# Patient Record
Sex: Female | Born: 1995 | Race: White | Hispanic: No | Marital: Single | Smoking: Never smoker
Health system: Southern US, Community
[De-identification: ages and names within clinical notes are randomized; demographics above are authoritative.]

---
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
## 2013-06-08 ENCOUNTER — Ambulatory Visit (INDEPENDENT_AMBULATORY_CARE_PROVIDER_SITE_OTHER): Payer: Managed Care, Other (non HMO) | Admitting: Family Medicine

## 2013-06-08 ENCOUNTER — Encounter: Payer: Self-pay | Admitting: Family Medicine

## 2013-06-08 VITALS — BP 107/72 | HR 90 | Wt 116.0 lb

## 2013-06-08 DIAGNOSIS — R1012 Left upper quadrant pain: Secondary | ICD-10-CM

## 2013-06-08 DIAGNOSIS — R0789 Other chest pain: Secondary | ICD-10-CM

## 2013-06-08 NOTE — Progress Notes (Signed)
   Subjective:    Patient ID: Ruth Aguilar, female    DOB: 01/25/1995, 18 y.o.   MRN: [id number]  HPI Left lateral chest wall pain x 4 days, bothers her more after she eats. Says starts under her left breast and radiates towards the lateral side wall. Doesn't really change with physical activity or change in position. Her psych recently started her buspiron.  No trauma or injury. Pain is better today but worse usually in the AM.  Pian is sharp and comes and goes and feels tight.  No nausea or change in bowels. No constipation. WAking her up at night. No fever, chillls or sweats. No radiation of pain. No SOB or palpitations.   Review of Systems     Objective:   Physical Exam  Constitutional: She is oriented to person, place, and time. She appears well-developed and well-nourished.  HENT:  Head: Normocephalic and atraumatic.  Right Ear: External ear normal.  Left Ear: External ear normal.  Nose: Nose normal.  Eyes: Conjunctivae and EOM are normal. Pupils are equal, round, and reactive to light.  Cardiovascular: Normal rate, regular rhythm and normal heart sounds.   Pulmonary/Chest: Effort normal and breath sounds normal. She has no wheezes. She exhibits no tenderness.  Abdominal: Soft. Bowel sounds are normal. She exhibits no distension and no mass. There is no tenderness. There is no rebound and no guarding.  Musculoskeletal: She exhibits no edema.  Lymphadenopathy:    She has no cervical adenopathy.  Neurological: She is alert and oriented to person, place, and time.  Skin: Skin is warm and dry.  Psychiatric: She has a normal mood and affect.          Assessment & Plan:  Left upper quadrant pain/atypical chest pain-unclear etiology at this point. It sounds somewhat gastric related since it does seem to get worse after eating but the location of her pain is a little bit on her stomach or esophageal pain. Consider pancreatitis and will do lab work to evaluate this. We'll also do CBC  with differential. She also recently started buspirone and stopped it yesterday and she actually does feel little bit better today. Certainly this could be related. I am want to do an EKG since her pain is over the left side of her lower chest and upper abdomen. She's not had any shortness of breath.  If labs are normal and still having pain then will start her on a PPI trial.   EKG shows rate of 66 beats per minute, normal sinus rhythm with no acute changes.Gave reassurance.

## 2013-06-09 LAB — CBC WITH DIFFERENTIAL/PLATELET
BASOS ABS: 0 10*3/uL (ref 0.0–0.1)
Basophils Relative: 0 % (ref 0–1)
EOS ABS: 0.1 10*3/uL (ref 0.0–0.7)
EOS PCT: 1 % (ref 0–5)
HCT: 40.5 % (ref 36.0–46.0)
Hemoglobin: 13.4 g/dL (ref 12.0–15.0)
Lymphocytes Relative: 26 % (ref 12–46)
Lymphs Abs: 1.8 10*3/uL (ref 0.7–4.0)
MCH: 29.3 pg (ref 26.0–34.0)
MCHC: 33.1 g/dL (ref 30.0–36.0)
MCV: 88.4 fL (ref 78.0–100.0)
Monocytes Absolute: 0.5 10*3/uL (ref 0.1–1.0)
Monocytes Relative: 7 % (ref 3–12)
Neutro Abs: 4.6 10*3/uL (ref 1.7–7.7)
Neutrophils Relative %: 66 % (ref 43–77)
PLATELETS: 277 10*3/uL (ref 150–400)
RBC: 4.58 MIL/uL (ref 3.87–5.11)
RDW: 14.1 % (ref 11.5–15.5)
WBC: 7 10*3/uL (ref 4.0–10.5)

## 2013-06-09 LAB — COMPLETE METABOLIC PANEL WITH GFR
ALK PHOS: 37 U/L — AB (ref 39–117)
ALT: 9 U/L (ref 0–35)
AST: 18 U/L (ref 0–37)
Albumin: 4.3 g/dL (ref 3.5–5.2)
BILIRUBIN TOTAL: 0.6 mg/dL (ref 0.2–1.2)
BUN: 12 mg/dL (ref 6–23)
CO2: 27 meq/L (ref 19–32)
CREATININE: 0.91 mg/dL (ref 0.50–1.10)
Calcium: 9.6 mg/dL (ref 8.4–10.5)
Chloride: 104 mEq/L (ref 96–112)
GFR, Est African American: >89 mL/min
GFR, Est Non African American: >89 mL/min
Glucose, Bld: 92 mg/dL (ref 70–99)
Potassium: 4.2 mEq/L (ref 3.5–5.3)
Sodium: 139 mEq/L (ref 135–145)
Total Protein: 7.5 g/dL (ref 6.0–8.3)

## 2013-06-09 LAB — AMYLASE: Amylase: 67 U/L (ref 0–105)

## 2013-06-09 LAB — LIPASE: Lipase: 55 U/L (ref 0–75)

## 2013-06-10 ENCOUNTER — Other Ambulatory Visit: Payer: Self-pay | Admitting: Family Medicine

## 2013-06-11 ENCOUNTER — Encounter: Payer: Self-pay | Admitting: *Deleted

## 2013-06-15 MED ORDER — OMEPRAZOLE 20 MG PO CPDR: 20.0000 mg | DELAYED_RELEASE_CAPSULE | Freq: Every day | ORAL | Status: DC

## 2013-06-17 ENCOUNTER — Telehealth: Payer: Self-pay | Admitting: *Deleted

## 2013-06-17 NOTE — Telephone Encounter (Signed)
LMOM notifying her that I'm placing her rx for Omeprazole at the front desk.  I've tried several attempts to get the correct pharmacy from the pt with no return call.

## 2016-06-13 ENCOUNTER — Ambulatory Visit: Payer: Managed Care, Other (non HMO) | Admitting: Physician Assistant

## 2016-06-14 ENCOUNTER — Ambulatory Visit (INDEPENDENT_AMBULATORY_CARE_PROVIDER_SITE_OTHER): Payer: Managed Care, Other (non HMO) | Admitting: Family Medicine

## 2016-06-14 ENCOUNTER — Encounter: Payer: Self-pay | Admitting: Family Medicine

## 2016-06-14 VITALS — BP 105/62 | HR 59 | Wt 118.0 lb

## 2016-06-14 DIAGNOSIS — Z111 Encounter for screening for respiratory tuberculosis: Secondary | ICD-10-CM | POA: Diagnosis not present

## 2016-06-14 DIAGNOSIS — Z0000 Encounter for general adult medical examination without abnormal findings: Secondary | ICD-10-CM | POA: Diagnosis not present

## 2016-06-14 NOTE — Progress Notes (Signed)
Subjective:     Ruth Aguilar is a 21 y.o. female and is here for a comprehensive physical exam. The patient reports no problems.  [place] to the area. 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 She's been volunteering at [employer].   Social History   Social History  . Marital status: Single    Spouse name: N/A  . Number of children: N/A  . Years of education: N/A   Occupational History  . Not on file.   Social History Main Topics  . Smoking status: Never Smoker  . Smokeless tobacco: Never Used  . Alcohol use No  . Drug use: No  . Sexual activity: No   Other Topics Concern  . Not on file   Social History Narrative  . No narrative on file   Health Maintenance  Topic Date Due  . HIV Screening  01/27/2008  . PAP SMEAR  04/26/2014  . INFLUENZA VACCINE  08/21/2016  . TETANUS/TDAP  06/25/2021    The following portions of the patient's history were reviewed and updated as appropriate: allergies, current medications, past family history, past medical history, past social history, past surgical history and problem list.  Review of Systems A comprehensive review of systems was negative.   Objective:    BP 105/62   Pulse (!) 59   Wt 118 lb (53.5 kg)   BMI 20.29 kg/m  General appearance: alert, cooperative and appears stated age Head: Normocephalic, without obvious abnormality, atraumatic Eyes: conj clear, EOMI, PEERLA Ears: normal TM's and external ear canals both ears Nose: Nares normal. Septum midline. Mucosa normal. No drainage or sinus tenderness. Throat: lips, mucosa, and tongue normal; teeth and gums normal Neck: no adenopathy, no carotid bruit, no JVD, supple, symmetrical, trachea midline and thyroid not enlarged, symmetric, no tenderness/mass/nodules Back: symmetric, no curvature. ROM normal. No CVA tenderness. Lungs: clear to auscultation  bilaterally Heart: regular rate and rhythm, S1, S2 normal, no murmur, click, rub or gallop Abdomen: soft, non-tender; bowel sounds normal; no masses,  no organomegaly Extremities: extremities normal, atraumatic, no cyanosis or edema Pulses: 2+ and symmetric Skin: Skin color, texture, turgor normal. No rashes or lesions Lymph nodes: Cervical, supraclavicular, and axillary nodes normal. Neurologic: Alert and oriented X 3, normal strength and tone. Normal symmetric reflexes. Normal coordination and gait    Assessment:    Healthy female exam.      Plan:     See After Visit Summary for Counseling Recommendations   Keep up a regular exercise program and make sure you are eating a healthy diet Try to eat 4 servings of dairy a day, or if you are lactose intolerant take a calcium with vitamin D daily.  Your vaccines are up to date.   She gets [facility] and says that she actually plans on getting back in with them this summer to get her Pap smear updated.

## 2016-06-14 NOTE — Patient Instructions (Addendum)
Health Maintenance, Female Adopting a healthy lifestyle and getting preventive care can go a long way to promote health and wellness. Talk with your health care provider about what schedule of regular examinations is right for you. This is a good chance for you to check in with your provider about disease prevention and staying healthy. In between checkups, there are plenty of things you can do on your own. Experts have done a lot of research about which lifestyle changes and preventive measures are most likely to keep you healthy. Ask your health care provider for more information. Weight and diet Eat a healthy diet  Be sure to include plenty of vegetables, fruits, low-fat dairy products, and lean protein.  Do not eat a lot of foods high in solid fats, added sugars, or salt.  Get regular exercise. This is one of the most important things you can do for your health.  Most adults should exercise for at least 150 minutes each week. The exercise should increase your heart rate and make you sweat (moderate-intensity exercise).  Most adults should also do strengthening exercises at least twice a week. This is in addition to the moderate-intensity exercise. Maintain a healthy weight  Body mass index (BMI) is a measurement that can be used to identify possible weight problems. It estimates body fat based on height and weight. Your health care provider can help determine your BMI and help you achieve or maintain a healthy weight.  For females 76 years of age and older:  A BMI below 18.5 is considered underweight.  A BMI of 18.5 to 24.9 is normal.  A BMI of 25 to 29.9 is considered overweight.  A BMI of 30 and above is considered obese. Watch levels of cholesterol and blood lipids  You should start having your blood tested for lipids and cholesterol at 23 years of age, then have this test every 5 years.  You may need to have your cholesterol levels checked more often if:  Your lipid or  cholesterol levels are high.  You are older than 23 years of age.  You are at high risk for heart disease. Cancer screening Lung Cancer  Lung cancer screening is recommended for adults 64-42 years old who are at high risk for lung cancer because of a history of smoking.  A yearly low-dose CT scan of the lungs is recommended for people who:  Currently smoke.  Have quit within the past 15 years.  Have at least a 30-pack-year history of smoking. A pack year is smoking an average of one pack of cigarettes a day for 1 year.  Yearly screening should continue until it has been 15 years since you quit.  Yearly screening should stop if you develop a health problem that would prevent you from having lung cancer treatment. Breast Cancer  Practice breast self-awareness. This means understanding how your breasts normally appear and feel.  It also means doing regular breast self-exams. Let your health care provider know about any changes, no matter how small.  If you are in your 20s or 30s, you should have a clinical breast exam (CBE) by a health care provider every 1-3 years as part of a regular health exam.  If you are 34 or older, have a CBE every year. Also consider having a breast X-ray (mammogram) every year.  If you have a family history of breast cancer, talk to your health care provider about genetic screening.  If you are at high risk for breast cancer, talk  to your health care provider about having an MRI and a mammogram every year.  Breast cancer gene (BRCA) assessment is recommended for women who have family members with BRCA-related cancers. BRCA-related cancers include:  Breast.  Ovarian.  Tubal.  Peritoneal cancers.  Results of the assessment will determine the need for genetic counseling and BRCA1 and BRCA2 testing. Cervical Cancer  Your health care provider may recommend that you be screened regularly for cancer of the pelvic organs (ovaries, uterus, and vagina).  This screening involves a pelvic examination, including checking for microscopic changes to the surface of your cervix (Pap test). You may be encouraged to have this screening done every 3 years, beginning at age 24.  For women ages 66-65, health care providers may recommend pelvic exams and Pap testing every 3 years, or they may recommend the Pap and pelvic exam, combined with testing for human papilloma virus (HPV), every 5 years. Some types of HPV increase your risk of cervical cancer. Testing for HPV may also be done on women of any age with unclear Pap test results.  Other health care providers may not recommend any screening for nonpregnant women who are considered low risk for pelvic cancer and who do not have symptoms. Ask your health care provider if a screening pelvic exam is right for you.  If you have had past treatment for cervical cancer or a condition that could lead to cancer, you need Pap tests and screening for cancer for at least 20 years after your treatment. If Pap tests have been discontinued, your risk factors (such as having a new sexual partner) need to be reassessed to determine if screening should resume. Some women have medical problems that increase the chance of getting cervical cancer. In these cases, your health care provider may recommend more frequent screening and Pap tests. Colorectal Cancer  This type of cancer can be detected and often prevented.  Routine colorectal cancer screening usually begins at 23 years of age and continues through 23 years of age.  Your health care provider may recommend screening at an earlier age if you have risk factors for colon cancer.  Your health care provider may also recommend using home test kits to check for hidden blood in the stool.  A small camera at the end of a tube can be used to examine your colon directly (sigmoidoscopy or colonoscopy). This is done to check for the earliest forms of colorectal cancer.  Routine  screening usually begins at age 41.  Direct examination of the colon should be repeated every 5-10 years through 23 years of age. However, you may need to be screened more often if early forms of precancerous polyps or small growths are found. Skin Cancer  Check your skin from head to toe regularly.  Tell your health care provider about any new moles or changes in moles, especially if there is a change in a mole's shape or color.  Also tell your health care provider if you have a mole that is larger than the size of a pencil eraser.  Always use sunscreen. Apply sunscreen liberally and repeatedly throughout the day.  Protect yourself by wearing long sleeves, pants, a wide-brimmed hat, and sunglasses whenever you are outside. Heart disease, diabetes, and high blood pressure  High blood pressure causes heart disease and increases the risk of stroke. High blood pressure is more likely to develop in:  People who have blood pressure in the high end of the normal range (130-139/85-89 mm Hg).  People who are overweight or obese.  People who are African American.  If you are 59-24 years of age, have your blood pressure checked every 3-5 years. If you are 34 years of age or older, have your blood pressure checked every year. You should have your blood pressure measured twice-once when you are at a hospital or clinic, and once when you are not at a hospital or clinic. Record the average of the two measurements. To check your blood pressure when you are not at a hospital or clinic, you can use:  An automated blood pressure machine at a pharmacy.  A home blood pressure monitor.  If you are between 29 years and 60 years old, ask your health care provider if you should take aspirin to prevent strokes.  Have regular diabetes screenings. This involves taking a blood sample to check your fasting blood sugar level.  If you are at a normal weight and have a low risk for diabetes, have this test once  every three years after 23 years of age.  If you are overweight and have a high risk for diabetes, consider being tested at a younger age or more often. Preventing infection Hepatitis B  If you have a higher risk for hepatitis B, you should be screened for this virus. You are considered at high risk for hepatitis B if:  You were born in a country where hepatitis B is common. Ask your health care provider which countries are considered high risk.  Your parents were born in a high-risk country, and you have not been immunized against hepatitis B (hepatitis B vaccine).  You have HIV or AIDS.  You use needles to inject street drugs.  You live with someone who has hepatitis B.  You have had sex with someone who has hepatitis B.  You get hemodialysis treatment.  You take certain medicines for conditions, including cancer, organ transplantation, and autoimmune conditions. Hepatitis C  Blood testing is recommended for:  Everyone born from [birth year] through 1967.  Anyone with known risk factors for hepatitis C. Sexually transmitted infections (STIs)  You should be screened for sexually transmitted infections (STIs) including gonorrhea and chlamydia if:  You are sexually active and are younger than 23 years of age.  You are older than 23 years of age and your health care provider tells you that you are at risk for this type of infection.  Your sexual activity has changed since you were last screened and you are at an increased risk for chlamydia or gonorrhea. Ask your health care provider if you are at risk.  If you do not have HIV, but are at risk, it may be recommended that you take a prescription medicine daily to prevent HIV infection. This is called pre-exposure prophylaxis (PrEP). You are considered at risk if:  You are sexually active and do not regularly use condoms or know the HIV status of your partner(s).  You take drugs by injection.  You are sexually active with a partner  who has HIV. Talk with your health care provider about whether you are at high risk of being infected with HIV. If you choose to begin PrEP, you should first be tested for HIV. You should then be tested every 3 months for as long as you are taking PrEP. Pregnancy  If you are premenopausal and you may become pregnant, ask your health care provider about preconception counseling.  If you may become pregnant, take 400 to 800 micrograms (mcg) of folic acid  every day.  If you want to prevent pregnancy, talk to your health care provider about birth control (contraception). Osteoporosis and menopause  Osteoporosis is a disease in which the bones lose minerals and strength with aging. This can result in serious bone fractures. Your risk for osteoporosis can be identified using a bone density scan.  If you are 4 years of age or older, or if you are at risk for osteoporosis and fractures, ask your health care provider if you should be screened.  Ask your health care provider whether you should take a calcium or vitamin D supplement to lower your risk for osteoporosis.  Menopause may have certain physical symptoms and risks.  Hormone replacement therapy may reduce some of these symptoms and risks. Talk to your health care provider about whether hormone replacement therapy is right for you. Follow these instructions at home:  Schedule regular health, dental, and eye exams.  Stay current with your immunizations.  Do not use any tobacco products including cigarettes, chewing tobacco, or electronic cigarettes.  If you are pregnant, do not drink alcohol.  If you are breastfeeding, limit how much and how often you drink alcohol.  Limit alcohol intake to no more than 1 drink per day for nonpregnant women. One drink equals 12 ounces of beer, 5 ounces of wine, or 1 ounces of hard liquor.  Do not use street drugs.  Do not share needles.  Ask your health care provider for help if you need support  or information about quitting drugs.  Tell your health care provider if you often feel depressed.  Tell your health care provider if you have ever been abused or do not feel safe at home. This information is not intended to replace advice given to you by your health care provider. Make sure you discuss any questions you have with your health care provider. Document Released: 07/23/2010 Document Revised: 06/15/2015 Document Reviewed: 10/11/2014 Elsevier Interactive Patient Education  2017 [employer].

## 2016-10-11 ENCOUNTER — Ambulatory Visit (INDEPENDENT_AMBULATORY_CARE_PROVIDER_SITE_OTHER): Payer: Managed Care, Other (non HMO) | Admitting: Physician Assistant

## 2016-10-11 VITALS — BP 109/70 | HR 67 | Wt 119.0 lb

## 2016-10-11 DIAGNOSIS — H538 Other visual disturbances: Secondary | ICD-10-CM

## 2016-10-11 NOTE — Progress Notes (Signed)
HPI:                                                                Ruth Aguilar is a 21 y.o. female who presents to [facility] today for blurred vision  This pleasant 21 yo F with no significant PMH reports blurred vision x 6 days. Onset was Sunday, gradually worsening. States she has difficulty focusing on people's faces to the point that she got lost in a restaurant and could not locate her family. Describes symptoms as feeling "like I'm in a cloud." Denies any head trauma or injury. She denies fever, chills, headache, dizziness, LOC, paresthesias, weakness, trouble word finding, or unsteady gait. Denies double vision, sudden loss of vision or eye pain. Denies hearing loss or tinnitus.  No past medical history on file. No past surgical history on file. Social History  Substance Use Topics  . Smoking status: Never Smoker  . Smokeless tobacco: Never Used  . Alcohol use No   family history is not on file.  ROS: Review of Systems  Constitutional: Negative.   HENT: Negative for hearing loss and tinnitus.   Eyes: Positive for blurred vision.  Cardiovascular: Negative.   Gastrointestinal: Negative for nausea and vomiting.  Musculoskeletal: Negative for falls and neck pain.  Neurological: Negative for dizziness, tingling, tremors, sensory change, speech change and headaches.     Medications: Current Outpatient Prescriptions  Medication Sig Dispense Refill  . Norethin-Eth Estradiol-Fe (GENERESS FE PO) Take 1 tablet by mouth daily.     No current facility-administered medications for this visit.    No Known Allergies   Objective:  BP 109/70   Pulse 67   Wt 119 lb (54 kg)   BMI 20.46 kg/m  Gen: well-groomed, not ill-appearing, no acute distress HEENT: head normocephalic, atraumatic; conjunctiva and cornea clear, oropharynx clear, moist mucus membranes Pulm: Normal work of breathing, normal phonation, clear to auscultation  bilaterally CV: Normal rate, regular rhythm, s1 and s2 distinct, no murmurs, clicks or rubs Neuro:  cranial nerves II-XII intact, no nystagmus, no papilledema, normal finger-to-nose, normal heel-to-shin, negative pronator drift, normal coordination, DTR's intact, normal tone, no tremor MSK: strength 5/5 and symmetric in bilateral upper and lower extremities, normal gait and station, negative Romberg Mental Status: alert and oriented x 3, speech articulate, and thought processes clear and goal-directed   No results found for this or any previous visit (from the past 72 hour(s)). No results found.    Assessment and Plan: 21 y.o. female with   1. Blurred vision - reassuring neurologic exam. Consulted Dr. Darra Lis who felt exam to be normal and recommend referral to Ophthalmology. Contacted 3 offices and was unable to get a same-day appointment. - Ambulatory referral to Ophthalmology   Patient education and anticipatory guidance given Patient agrees with treatment plan Follow-up as needed if symptoms worsen or fail to improve  Levonne Hubert PA-C

## 2016-10-14 ENCOUNTER — Encounter: Payer: Self-pay | Admitting: Physician Assistant

## 2016-10-14 DIAGNOSIS — H538 Other visual disturbances: Secondary | ICD-10-CM | POA: Insufficient documentation

## 2018-05-13 ENCOUNTER — Encounter: Payer: Self-pay | Admitting: Family Medicine

## 2018-05-13 ENCOUNTER — Ambulatory Visit (INDEPENDENT_AMBULATORY_CARE_PROVIDER_SITE_OTHER): Payer: Managed Care, Other (non HMO) | Admitting: Family Medicine

## 2018-05-13 VITALS — Temp 98.9°F | Ht 62.5 in | Wt 118.0 lb

## 2018-05-13 DIAGNOSIS — R21 Rash and other nonspecific skin eruption: Secondary | ICD-10-CM

## 2018-05-13 DIAGNOSIS — Z008 Encounter for other general examination: Secondary | ICD-10-CM | POA: Insufficient documentation

## 2018-05-13 MED ORDER — PREDNISONE 20 MG PO TABS: ORAL_TABLET | ORAL | 0 refills | Status: AC

## 2018-05-13 NOTE — Progress Notes (Signed)
Virtual Visit via Video Note  I connected with Ruth Aguilar on 05/13/18 at  9:30 AM EDT by a video enabled telemedicine application and verified that I am speaking with the correct person using two identifiers.   I discussed the limitations of evaluation and management by telemedicine and the availability of in person appointments. The patient expressed understanding and agreed to proceed.  Patient was at home and I was in my office for this virtual visit.  Subjective:    CC: Rash   HPI: 23 year old female complains of a rash that is been on her face for about a week.  She says that the rash is itchy and feels like it gets hot.  She says about 2-3 weeks ago she actually had poison oak on her arms and legs and was treated with steroids.  She finishe her steroids on the 12th and then facial rash started 2 days later.  She says that did seem to clear up.  She has tried using just a little bit of triamcinolone on her face.  She denies any other changes to soaps, lotions, detergents etc. No correlation to diet.  It is really umcomfortable.  3 days had not symptoms. Feels really tired. No fever.  She says that the lesions on her legs and abdomen have resolved.   Past medical history, Surgical history, Family history not pertinant except as noted below, Social history, Allergies, and medications have been entered into the medical record, reviewed, and corrections made.   Temp 98.9 F (37.2 C)   Ht 5' 2.5" (1.588 m)   Wt 118 lb (53.5 kg)   LMP 04/24/2018 (Approximate)   BMI 21.24 kg/m     No Known Allergies  No past medical history on file.  No past surgical history on file.  Social History   Socioeconomic History  . Marital status: Single    Spouse name: Not on file  . Number of children: Not on file  . Years of education: Not on file  . Highest education level: Not on file  Occupational History  . Not on file  Social Needs  . Financial resource strain: Not on file  . Food  insecurity:    Worry: Not on file    Inability: Not on file  . Transportation needs:    Medical: Not on file    Non-medical: Not on file  Tobacco Use  . Smoking status: Never Smoker  . Smokeless tobacco: Never Used  Substance and Sexual Activity  . Alcohol use: No  . Drug use: No  . Sexual activity: Never    Birth control/protection: Pill  Lifestyle  . Physical activity:    Days per week: Not on file    Minutes per session: Not on file  . Stress: Not on file  Relationships  . Social connections:    Talks on phone: Not on file    Gets together: Not on file    Attends religious service: Not on file    Active member of club or organization: Not on file    Attends meetings of clubs or organizations: Not on file    Relationship status: Not on file  . Intimate partner violence:    Fear of current or ex partner: Not on file    Emotionally abused: Not on file    Physically abused: Not on file    Forced sexual activity: Not on file  Other Topics Concern  . Not on file  Social History Narrative  . Not  on file    No family history on file.  Outpatient Encounter Medications as of 05/13/2018  Medication Sig  . ESTARYLLA 0.25-35 MG-MCG tablet   . triamcinolone ointment (KENALOG) 0.1 %   . predniSONE (DELTASONE) 20 MG tablet Take 1 tablet (20 mg total) by mouth daily with breakfast for 6 days, THEN 0.5 tablets (10 mg total) daily with breakfast for 6 days.  . [DISCONTINUED] Norethin-Eth Estradiol-Fe (GENERESS FE PO) Take 1 tablet by mouth daily.   No facility-administered encounter medications on file as of 05/13/2018.        Objective:    General: Speaking clearly in complete sentences without any shortness of breath.  Alert and oriented x3.  Normal judgment. No apparent acute distress.  She did send me some pictures over my prior chart which I did view and she does have a little bit of erythema to her facial cheeks.  No open wounds or drainage.    Impression and  Recommendations:    Rash  On her face.  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  We discussed treatment options I like to put her on another round of prednisone low-dose since is just affecting her face at this point.  I want her to really stay out of the sun as well I think she is getting some sun sensitivity which is worsening and triggering the rash especially since she says it feels better when she first gets up in the morning and then as the day goes on it gets worse.  She said she is been outside a lot and has been working out outside.  If she is not improving after 1 to 2 weeks then please let me know and I will do additional work-up including some blood work.    I discussed the assessment and treatment plan with the patient. The patient was provided an opportunity to ask questions and all were answered. The patient agreed with the plan and demonstrated an understanding of the instructions.   The patient was advised to call back or seek an in-person evaluation if the symptoms worsen or if the condition fails to improve as anticipated.   Nani Gasser, MD

## 2018-08-14 ENCOUNTER — Telehealth (INDEPENDENT_AMBULATORY_CARE_PROVIDER_SITE_OTHER): Payer: Managed Care, Other (non HMO) | Admitting: Family Medicine

## 2018-08-14 ENCOUNTER — Encounter: Payer: Self-pay | Admitting: Family Medicine

## 2018-08-14 VITALS — Temp 98.0°F | Ht 62.52 in | Wt 119.1 lb

## 2018-08-14 DIAGNOSIS — R1013 Epigastric pain: Secondary | ICD-10-CM | POA: Diagnosis not present

## 2018-08-14 MED ORDER — PROMETHAZINE HCL 25 MG PO TABS: 25.0000 mg | ORAL_TABLET | Freq: Three times a day (TID) | ORAL | 0 refills | Status: DC | PRN

## 2018-08-14 MED ORDER — OMEPRAZOLE 40 MG PO CPDR: 40.0000 mg | DELAYED_RELEASE_CAPSULE | Freq: Every day | ORAL | 0 refills | Status: DC

## 2018-08-14 NOTE — Progress Notes (Signed)
Pt reports that her sxs have been ongoing x 4 days..  She said that the abdominal pain will last like majority of the day and let up for about 10-15 mins. 2 days ago she felt nauseated and had dry heaves. She has tried eating more bland foods. She said that she just feels terrible but not "sick". Pt usually does cross-fit daily and said that she hasn't been able to work out any this week. She reports that her body aches and her muscles feel like she has been working out but she hasn't. Last BM was yesterday she reports that it was normal. She last ate around 1130 AM she had 1/2 bagle and tuna patty on lettuce w/cheese.  Has had some headaches but said that it's more like the headaches are coming from her feeling out of it.   Denies fevers but reports that during the day she's sweaty and at night she feels cold. No vomiting or diarrhea.  I asked if anyone in her family has a hx of GI issues and she said that her 24 y.o. sister is having her stool tested. Mother has RA but she feels that any sxs that she may have could be coming from her medications.  Maryruth Eve, Lahoma Crocker, CMA

## 2018-08-14 NOTE — Assessment & Plan Note (Addendum)
  Unclear etiology as the discomfort and nausea is persistent but it does get worse after she eats occur it certainly could be her gallbladder.  We will get her scheduled for ultrasound for further work-up.  She has not had a fever or other upper respiratory symptoms which is reassuring but also could consider diagnosis of COVID as well.  Especially as some people are presenting with GI symptoms.  May go ahead and send over prescription for nausea medicine as well. Also recommend a trial of omeprazole over the weekend as well as she could have some element of gastritis going on.

## 2018-08-14 NOTE — Progress Notes (Signed)
Virtual Visit via Video Note  I connected with [name] on 08/14/18 at  2:00 PM EDT by a video enabled telemedicine application and verified that I am speaking with the correct person using two identifiers.   I discussed the limitations of evaluation and management by telemedicine and the availability of in person appointments. The patient expressed understanding and agreed to proceed.   Acute Office Visit  Subjective:    Patient ID: Ruth Aguilar, female    DOB: 1995-12-26, 23 y.o.   MRN: [id number]  Chief Complaint  Patient presents with  . Fatigue  . Nausea  . Abdominal Pain    HPI Patient is in today for     Pt reports that her sxs have been ongoing x 4 days..  She said that the abdominal pain will last like majority of the day and let up for about 10-15 mins. She says the nausea is orse than the pain2 days ago she felt nauseated and had dry heaves. She has tried eating more bland foods. She said that she just feels terrible but not "sick". Pt usually does cross-fit daily and said that she hasn't been able to work out any this week. She reports that her body aches and her muscles feel like she has been working out but she hasn't. Gets a hard heavy pain in her chest.  She had a severe HA and dizziness the first day.Having hot and cold but hasn't had a temperature.  No diarrhea or loose stools.  She denies any heartburn but says she has been belching a lot.  Has had some headaches but said that it's more like the headaches are coming from her feeling. That is better today.     History reviewed. No pertinent past medical history.  History reviewed. No pertinent surgical history.  History reviewed. No pertinent family history.  Social History   Socioeconomic History  . Marital status: Single    Spouse name: Not on file  . Number of children: Not on file  . Years of education: Not on file  . Highest education level: Not on file  Occupational History  . Not on file   Social Needs  . Financial resource strain: Not on file  . Food insecurity    Worry: Not on file    Inability: Not on file  . Transportation needs    Medical: Not on file    Non-medical: Not on file  Tobacco Use  . Smoking status: Never Smoker  . Smokeless tobacco: Never Used  Substance and Sexual Activity  . Alcohol use: No  . Drug use: No  . Sexual activity: Never    Birth control/protection: Pill  Lifestyle  . Physical activity    Days per week: Not on file    Minutes per session: Not on file  . Stress: Not on file  Relationships  . Social Herbalist on phone: Not on file    Gets together: Not on file    Attends religious service: Not on file    Active member of club or organization: Not on file    Attends meetings of clubs or organizations: Not on file    Relationship status: Not on file  . Intimate partner violence    Fear of current or ex partner: Not on file    Emotionally abused: Not on file    Physically abused: Not on file    Forced sexual activity: Not on file  Other Topics Concern  .  Not on file  Social History Narrative  . Not on file    Outpatient Medications Prior to Visit  Medication Sig Dispense Refill  . B Complex Vitamins (B COMPLEX PO) Take 2 capsules by mouth daily.    Marland Kitchen. ESTARYLLA 0.25-35 MG-MCG tablet     . triamcinolone ointment (KENALOG) 0.1 %      No facility-administered medications prior to visit.     No Known Allergies  ROS     Objective:    Physical Exam   Speaking clearly. No inc work of breath. Had her press on her abdomen and says it is not tender.  Alert and oriented.    Temp 98 F (36.7 C)   Ht 5' 2.52" (1.588 m)   Wt 119 lb 1.6 oz (54 kg)   LMP 08/03/2018 (Exact Date)   BMI 21.42 kg/m  Wt Readings from Last 3 Encounters:  08/14/18 119 lb 1.6 oz (54 kg)  05/13/18 118 lb (53.5 kg)  10/11/16 119 lb (54 kg)    Health Maintenance Due  Topic Date Due  . HIV Screening  01/27/2008  . PAP-Cervical  Cytology Screening  01/26/2014  . PAP SMEAR-Modifier  04/26/2014    There are no preventive care reminders to display for this patient.   No results found for: TSH Lab Results  Component Value Date   WBC 7.0 06/08/2013   HGB 13.4 06/08/2013   HCT 40.5 06/08/2013   MCV 88.4 06/08/2013   PLT 277 06/08/2013   Lab Results  Component Value Date   NA 139 06/08/2013   K 4.2 06/08/2013   CO2 27 06/08/2013   GLUCOSE 92 06/08/2013   BUN 12 06/08/2013   CREATININE 0.91 06/08/2013   BILITOT 0.6 06/08/2013   ALKPHOS 37 (L) 06/08/2013   AST 18 06/08/2013   ALT 9 06/08/2013   PROT 7.5 06/08/2013   ALBUMIN 4.3 06/08/2013   CALCIUM 9.6 06/08/2013   No results found for: CHOL No results found for: HDL No results found for: LDLCALC No results found for: TRIG No results found for: CHOLHDL No results found for: [id number]     Assessment & Plan:   Problem List Items Addressed This Visit      Other   Epigastric pain - Primary     Unclear etiology as the discomfort and nausea is persistent but it does get worse after she eats occur it certainly could be her gallbladder.  We will get her scheduled for ultrasound for further work-up.  She has not had a fever or other upper respiratory symptoms which is reassuring but also could consider diagnosis of COVID as well.  Especially as some people are presenting with GI symptoms.  May go ahead and send over prescription for nausea medicine as well. Also recommend a trial of omeprazole over the weekend as well as she could have some element of gastritis going on.      Relevant Orders   US Abdomen Complete       Meds ordered this encounter  Medications  . omeprazole (PRILOSEC) 40 MG capsule    Sig: Take 1 capsule (40 mg total) by mouth daily.    Dispense:  30 capsule    Refill:  0  . promethazine (PHENERGAN) 25 MG tablet    Sig: Take 1 tablet (25 mg total) by mouth every 8 (eight) hours as needed for nausea or vomiting.    Dispense:  20  tablet    Refill:  0  I discussed the assessment and treatment plan with the patient. The patient was provided an opportunity to ask questions and all were answered. The patient agreed with the plan and demonstrated an understanding of the instructions.   The patient was advised to call back or seek an in-person evaluation if the symptoms worsen or if the condition fails to improve as anticipated.  Nani Gasseratherine Alfie Rideaux, MD

## 2018-08-15 ENCOUNTER — Other Ambulatory Visit: Payer: Self-pay

## 2018-08-15 ENCOUNTER — Emergency Department (INDEPENDENT_AMBULATORY_CARE_PROVIDER_SITE_OTHER)
Admission: EM | Admit: 2018-08-15 | Discharge: 2018-08-15 | Disposition: A | Discharge status: Home / Self Care | Payer: Managed Care, Other (non HMO) | Source: Home / Self Care

## 2018-08-15 ENCOUNTER — Ambulatory Visit (HOSPITAL_BASED_OUTPATIENT_CLINIC_OR_DEPARTMENT_OTHER)
Admission: RE | Admit: 2018-08-15 | Discharge: 2018-08-15 | Disposition: A | Discharge status: Home / Self Care | Payer: Managed Care, Other (non HMO) | Source: Ambulatory Visit | Attending: Family Medicine | Admitting: Family Medicine

## 2018-08-15 ENCOUNTER — Encounter: Payer: Self-pay | Admitting: Emergency Medicine

## 2018-08-15 DIAGNOSIS — R1013 Epigastric pain: Secondary | ICD-10-CM | POA: Insufficient documentation

## 2018-08-15 DIAGNOSIS — R110 Nausea: Secondary | ICD-10-CM

## 2018-08-15 DIAGNOSIS — R160 Hepatomegaly, not elsewhere classified: Secondary | ICD-10-CM

## 2018-08-15 DIAGNOSIS — R079 Chest pain, unspecified: Secondary | ICD-10-CM

## 2018-08-15 LAB — COMPLETE METABOLIC PANEL WITH GFR
AG Ratio: 1.5 (calc) (ref 1.0–2.5)
ALT: 19 U/L (ref 6–29)
AST: 24 U/L (ref 10–30)
Albumin: 4.5 g/dL (ref 3.6–5.1)
Alkaline phosphatase (APISO): 37 U/L (ref 31–125)
BUN: 19 mg/dL (ref 7–25)
CO2: 28 mmol/L (ref 20–32)
Calcium: 9.8 mg/dL (ref 8.6–10.2)
Chloride: 106 mmol/L (ref 98–110)
Creat: 1.04 mg/dL (ref 0.50–1.10)
GFR, Est African American: 86 mL/min/{1.73_m2} (ref >=60)
GFR, Est Non African American: 75 mL/min/{1.73_m2} (ref >=60)
Globulin: 3 g/dL (calc) (ref 1.9–3.7)
Glucose, Bld: 104 mg/dL — ABNORMAL HIGH (ref 65–99)
Potassium: 4.7 mmol/L (ref 3.5–5.3)
Sodium: 139 mmol/L (ref 135–146)
Total Bilirubin: 0.7 mg/dL (ref 0.2–1.2)
Total Protein: 7.5 g/dL (ref 6.1–8.1)

## 2018-08-15 LAB — POCT CBC W AUTO DIFF (K'VILLE URGENT CARE)

## 2018-08-15 LAB — POCT URINALYSIS DIP (MANUAL ENTRY)
Bilirubin, UA: NEGATIVE
Blood, UA: NEGATIVE
Glucose, UA: NEGATIVE mg/dL
Ketones, POC UA: NEGATIVE mg/dL
Leukocytes, UA: NEGATIVE
Nitrite, UA: NEGATIVE
Protein Ur, POC: NEGATIVE mg/dL
Spec Grav, UA: 1.025 (ref 1.010–1.025)
Urobilinogen, UA: NEGATIVE E.U./dL — AB
pH, UA: 6 (ref 5.0–8.0)

## 2018-08-15 LAB — POCT URINE PREGNANCY: Preg Test, Ur: NEGATIVE

## 2018-08-15 LAB — LIPASE: Lipase: 80 U/L — ABNORMAL HIGH (ref 7–60)

## 2018-08-15 NOTE — ED Triage Notes (Signed)
Patient has been experiencing pp nausea and abdominal cramping without vomiting or diarrhea for past 5 days. Had video chat with Dr. Madilyn Fireman yesterday and abdominal US done today with instructions to to get COVID test afterwards here. Patient wonders if she has Rhabdomyolysis; exercises often/intesively.

## 2018-08-15 NOTE — ED Provider Notes (Signed)
[facility]    CSN: [id number] Arrival date & time: 08/15/18  1219     History   Chief Complaint Chief Complaint  Patient presents with  . Nausea  . Abdominal Cramping    HPI Ruth Aguilar is a 23 y.o. female.   HPI Ruth Aguilar is a 23 y.o. female presenting to UC with c/o nausea w/o vomiting and upper abdominal pain/cramping for 5 days.  She completed a Video visit with her PCP, Dr. Linford ArnoldMetheney, yesterday. She was prescribed omeprazole and phenergan and had an ultrasound ordered for today.  Pt took phenergan, which she had at home already, last night but no relief of nausea. She had not tried it until last night. She has not picked up her omeprazole because she wanted to see what the ultrasound indicated.  Pt denies fever, chills, cough. She does have mild centralized chest pain, she questions if it is acid reflux but no prior hx of heart burn so she is unsure what it feels like.  She has been able to eat and drink but eating makes nausea significantly worse. The type of food does not seem to matter.  Denies urinary symptoms. No sick contacts or recent travel.     History reviewed. No pertinent past medical history.  Patient Active Problem List   Diagnosis Date Noted  . Epigastric pain 08/14/2018  . Blurred vision 10/14/2016    History reviewed. No pertinent surgical history.  OB History   No obstetric history on file.      Home Medications    Prior to Admission medications   Medication Sig Start Date End Date Taking? Authorizing Provider  B Complex Vitamins (B COMPLEX PO) Take 2 capsules by mouth daily.    [provider]  Normajean BaxterESTARYLLA 0.25-35 MG-MCG tablet  03/31/18   [provider]  omeprazole (PRILOSEC) 40 MG capsule Take 1 capsule (40 mg total) by mouth daily. 08/14/18   Agapito GamesMetheney, Catherine D, MD  promethazine (PHENERGAN) 25 MG tablet Take 1 tablet (25 mg total) by mouth every 8 (eight) hours as needed for nausea or vomiting. 08/14/18    Agapito GamesMetheney, Catherine D, MD    Family History No family history on file.  Social History Social History   Tobacco Use  . Smoking status: Never Smoker  . Smokeless tobacco: Never Used  Substance Use Topics  . Alcohol use: No  . Drug use: No     Allergies   Patient has no known allergies.   Review of Systems Review of Systems  Constitutional: Negative for chills and fever.  HENT: Negative for congestion, ear pain, sore throat, trouble swallowing and voice change.   Respiratory: Negative for cough, chest tightness and shortness of breath.   Cardiovascular: Positive for chest pain (heartburn?). Negative for palpitations.  Gastrointestinal: Positive for abdominal pain and nausea. Negative for diarrhea and vomiting.  Musculoskeletal: Negative for arthralgias, back pain and myalgias.  Skin: Negative for rash.     Physical Exam Triage Vital Signs ED Triage Vitals  Enc Vitals Group     BP 08/15/18 1317 (!) 99/6     Pulse Rate 08/15/18 1317 65     Resp 08/15/18 1317 16     Temp 08/15/18 1317 98.4 F (36.9 C)     Temp Source 08/15/18 1317 Oral     SpO2 08/15/18 1317 99 %     Weight --      Height --      Head Circumference --  Peak Flow --      Pain Score 08/15/18 1318 0     Pain Loc --      Pain Edu? --      Excl. in GC? --    No data found.  Updated Vital Signs BP 99/65   Pulse 65   Temp 98.4 F (36.9 C) (Oral)   Resp 16   LMP 08/03/2018 (Exact Date)   SpO2 99%   Visual Acuity Right Eye Distance:   Left Eye Distance:   Bilateral Distance:    Right Eye Near:   Left Eye Near:    Bilateral Near:     Physical Exam Vitals signs and nursing note reviewed.  Constitutional:      Appearance: Normal appearance. She is well-developed.  HENT:     Head: Normocephalic and atraumatic.     Right Ear: Tympanic membrane normal.     Left Ear: Tympanic membrane normal.     Nose: Nose normal.     Right Sinus: No maxillary sinus tenderness or frontal sinus  tenderness.     Left Sinus: No maxillary sinus tenderness or frontal sinus tenderness.     Mouth/Throat:     Lips: Pink.     Mouth: Mucous membranes are moist.     Pharynx: Oropharynx is clear. Uvula midline.  Neck:     Musculoskeletal: Normal range of motion.  Cardiovascular:     Rate and Rhythm: Normal rate and regular rhythm.  Pulmonary:     Effort: Pulmonary effort is normal.     Breath sounds: Normal breath sounds.  Abdominal:     General: Abdomen is flat. There is no distension.     Palpations: Abdomen is soft.     Tenderness: There is abdominal tenderness ( mild) in the right upper quadrant, epigastric area and left upper quadrant. There is no right CVA tenderness or left CVA tenderness.  Musculoskeletal: Normal range of motion.  Skin:    General: Skin is warm and dry.  Neurological:     Mental Status: She is alert and oriented to person, place, and time.  Psychiatric:        Behavior: Behavior normal.      UC Treatments / Results  Labs (all labs ordered are listed, but only abnormal results are displayed) Labs Reviewed  POCT URINALYSIS DIP (MANUAL ENTRY) - Abnormal; Notable for the following components:      Result Value   Urobilinogen, UA negative (*)    All other components within normal limits  NOVEL CORONAVIRUS, NAA  COMPLETE METABOLIC PANEL WITH GFR  LIPASE  HEPATITIS PANEL, ACUTE  POCT CBC W AUTO DIFF ([facility])  POCT URINE PREGNANCY    EKG   Radiology [place] Abdomen Complete  Result Date: 08/15/2018 CLINICAL DATA:  Epigastric pain for 5 days, constant. Pain is worse postprandial. Nausea. EXAM: ABDOMEN ULTRASOUND COMPLETE COMPARISON:  None. FINDINGS: Gallbladder: Gallbladder has a normal appearance. Gallbladder wall is 1.7 millimeters, within normal limits. No stones or pericholecystic fluid. No sonographic Murphy's sign. Common bile duct: Diameter: Nonvisualized Liver: Within the inferior aspect of the RIGHT LOWER lobe there is a solid vascular  mass measuring 4.4 x 4.0 x 4.0 centimeters. Hepatic echotexture is normal. Portal vein is patent on color Doppler imaging with normal direction of blood flow towards the liver. IVC: No abnormality visualized. Pancreas: Visualized portion unremarkable. Spleen: Size and appearance within normal limits. Right Kidney: Length: 9.7 centimeters. Echogenicity within normal limits. No mass or hydronephrosis visualized. Left Kidney:  Length: 10.4 centimeter. Echogenicity within normal limits. No mass or hydronephrosis visualized. Abdominal aorta: No aneurysm visualized. Other findings: None. IMPRESSION: 4.4 centimeter solid hepatic mass in the LOWER aspect of the RIGHT hepatic lobe warranting further evaluation. Recommend liver protocol MRI for characterization. No evidence for acute cholecystitis or other acute abnormality. These results will be called to the ordering clinician or representative by the Radiologist Assistant, and communication documented in the PACS or zVision Dashboard. Electronically Signed   By: Nolon Nations M.D.   On: 08/15/2018 12:21    Procedures Procedures (including critical care time)  Medications Ordered in UC Medications - No data to display  Initial Impression / Assessment and Plan / UC Course  I have reviewed the triage vital signs and the nursing notes.  Pertinent labs & imaging results that were available during my care of the patient were reviewed by me and considered in my medical decision making (see chart for details).    Reviewed medical records and discussed pt with Dr. Madilyn Fireman. Will sent Hepatitis panel and test for Covid  Pt to f/u with PCP for further evaluation of liver mass. Encouraged to take medication prescribed by Dr. Madilyn Fireman to see if it helps over the weekend. Discussed symptoms that warrant emergent care in the ED.  Final Clinical Impressions(s) / UC Diagnoses   Final diagnoses:  Nausea without vomiting  Liver mass, right lobe  Nonspecific chest  pain     Discharge Instructions      Please take your medications as prescribed by Dr. Madilyn Fireman yesterday and please reach out to her office to schedule an appointment next week to discuss your ultrasound report as well as lab tests performed today. Your test results should come back in about 2-3 days. Covid, while less likely at this time, can take 2-14 days to receive results.  Try to stick with a bland diet, stay well hydrated and stay home/isolated as much as possible until results come back. If you MUST go out and be around others, always wear a mask and wash hands frequently.     ED Prescriptions    None     Controlled Substance Prescriptions [place] Controlled Substance Registry consulted? Not Applicable   Tyrell Antonio 08/15/18 1420

## 2018-08-15 NOTE — Discharge Instructions (Signed)
°  Please take your medications as prescribed by Dr. Madilyn Fireman yesterday and please reach out to her office to schedule an appointment next week to discuss your ultrasound report as well as lab tests performed today. Your test results should come back in about 2-3 days. Covid, while less likely at this time, can take 2-14 days to receive results.  Try to stick with a bland diet, stay well hydrated and stay home/isolated as much as possible until results come back. If you MUST go out and be around others, always wear a mask and wash hands frequently.

## 2018-08-16 ENCOUNTER — Telehealth: Payer: Self-pay

## 2018-08-16 DIAGNOSIS — K769 Liver disease, unspecified: Secondary | ICD-10-CM

## 2018-08-16 DIAGNOSIS — R110 Nausea: Secondary | ICD-10-CM

## 2018-08-16 DIAGNOSIS — R1013 Epigastric pain: Secondary | ICD-10-CM

## 2018-08-16 DIAGNOSIS — R160 Hepatomegaly, not elsewhere classified: Secondary | ICD-10-CM

## 2018-08-16 DIAGNOSIS — R0789 Other chest pain: Secondary | ICD-10-CM

## 2018-08-16 NOTE — Telephone Encounter (Signed)
Left message on VM to call for lab results.   Pt called back has felt better this afternoon.  Will reach out to Dr Ward Chatters to see where to go from here

## 2018-08-17 ENCOUNTER — Emergency Department (INDEPENDENT_AMBULATORY_CARE_PROVIDER_SITE_OTHER): Payer: Managed Care, Other (non HMO)

## 2018-08-17 ENCOUNTER — Encounter: Payer: Self-pay | Admitting: Emergency Medicine

## 2018-08-17 ENCOUNTER — Other Ambulatory Visit: Payer: Self-pay

## 2018-08-17 ENCOUNTER — Emergency Department
Admission: EM | Admit: 2018-08-17 | Discharge: 2018-08-17 | Payer: Managed Care, Other (non HMO) | Source: Home / Self Care

## 2018-08-17 DIAGNOSIS — R0789 Other chest pain: Secondary | ICD-10-CM | POA: Diagnosis not present

## 2018-08-17 LAB — HEPATITIS PANEL, ACUTE
Hep A IgM: NONREACTIVE
Hep B C IgM: NONREACTIVE
Hepatitis B Surface Ag: NONREACTIVE
Hepatitis C Ab: NONREACTIVE
SIGNAL TO CUT-OFF: 0.08 (ref <1.00)

## 2018-08-17 NOTE — ED Triage Notes (Signed)
Patient was sent here per Dr Eustaquio Boyden to get chest x-ray. She discussed labs and ultrasound over the phone today.

## 2018-08-17 NOTE — Telephone Encounter (Signed)
Call patient about her results.  MRI ordered for further work-up.  We will also get chest x-ray if she continues to have some persistent chest discomfort.  Mid chest underneath the sternum.  She has been taken the omeprazole and the Phenergan it has not really helped tremendously.  Though she does feel a little better today.  We will move forward with chest x-ray and may need further evaluation.  Says she feels a little bit short of breath with it at times as well.

## 2018-08-18 ENCOUNTER — Telehealth: Payer: Self-pay

## 2018-08-18 ENCOUNTER — Ambulatory Visit (HOSPITAL_COMMUNITY)
Admission: RE | Admit: 2018-08-18 | Discharge: 2018-08-18 | Disposition: A | Discharge status: Home / Self Care | Payer: Managed Care, Other (non HMO) | Source: Ambulatory Visit | Attending: Family Medicine | Admitting: Family Medicine

## 2018-08-18 DIAGNOSIS — R110 Nausea: Secondary | ICD-10-CM

## 2018-08-18 DIAGNOSIS — R160 Hepatomegaly, not elsewhere classified: Secondary | ICD-10-CM

## 2018-08-18 DIAGNOSIS — R1013 Epigastric pain: Secondary | ICD-10-CM | POA: Diagnosis present

## 2018-08-18 MED ORDER — GADOBUTROL 1 MMOL/ML IV SOLN
5.0000 mL | Freq: Once | INTRAVENOUS | Status: AC | PRN
  Administered 2018-08-18: 5 mL via INTRAVENOUS

## 2018-08-18 NOTE — Telephone Encounter (Signed)
Auth for MRI was obtained yesterday around 2:00 PM. Please contact me if any further needs.  Direct # [phone]

## 2018-08-18 NOTE — Telephone Encounter (Signed)
-----   Message from Hali Marry, MD sent at 08/18/2018  7:34 AM EDT ----- Regarding: FW: need preauth pls for her mri  ----- Message ----- From: Augustine Radar Sent: 08/17/2018   3:18 PM EDT To: Hali Marry, MD Subject: need preauth pls for her mri

## 2018-08-19 LAB — NOVEL CORONAVIRUS, NAA: SARS-CoV-2, NAA: NOT DETECTED

## 2018-09-01 ENCOUNTER — Other Ambulatory Visit: Payer: Self-pay | Admitting: Family Medicine

## 2018-09-02 MED ORDER — PROMETHAZINE HCL 25 MG PO TABS: 25.0000 mg | ORAL_TABLET | Freq: Three times a day (TID) | ORAL | 0 refills | Status: DC | PRN

## 2018-09-02 NOTE — Telephone Encounter (Signed)
Short term med- OK to RF?

## 2018-09-14 ENCOUNTER — Encounter: Payer: Self-pay | Admitting: Family Medicine

## 2018-09-14 NOTE — Telephone Encounter (Signed)
I agree. It sounds like there might be more to it. Let get her scheduled fo rnext week.

## 2018-09-14 NOTE — Telephone Encounter (Signed)
Patient states she would only really like to speak to PCP. Nothing available until Friday. Please advise what would be best. Patient states that even a message back on mychart from PCP would be fine.

## 2018-09-14 NOTE — Telephone Encounter (Signed)
Called patient to schedule virtual appointment.  I had to leave a message @ 3:56pm.  Ask patient to call back to schedule appointment.

## 2018-09-15 NOTE — Telephone Encounter (Signed)
Appointment has been made. No further questions at this time.  

## 2018-09-15 NOTE — Telephone Encounter (Signed)
Taken care of

## 2018-09-21 ENCOUNTER — Encounter: Payer: Self-pay | Admitting: Family Medicine

## 2018-09-21 ENCOUNTER — Telehealth (INDEPENDENT_AMBULATORY_CARE_PROVIDER_SITE_OTHER): Payer: Managed Care, Other (non HMO) | Admitting: Family Medicine

## 2018-09-21 VITALS — Temp 97.9°F | Wt 120.0 lb

## 2018-09-21 DIAGNOSIS — K769 Liver disease, unspecified: Secondary | ICD-10-CM | POA: Insufficient documentation

## 2018-09-21 DIAGNOSIS — F439 Reaction to severe stress, unspecified: Secondary | ICD-10-CM | POA: Diagnosis not present

## 2018-09-21 DIAGNOSIS — R1013 Epigastric pain: Secondary | ICD-10-CM

## 2018-09-21 MED ORDER — ESCITALOPRAM OXALATE 10 MG PO TABS: ORAL_TABLET | ORAL | 1 refills | Status: DC

## 2018-09-21 NOTE — Assessment & Plan Note (Signed)
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  Repeat ultrasound in 1 year.

## 2018-09-21 NOTE — Assessment & Plan Note (Signed)
PPI was not helpful.  Ultrasound showed a liver lesion with follow-up MRI showing likely benign lesion though they did recommend repeat with ultrasound in 1 year.  Eliminating gluten and dairy for short periods of time have not helped.  We did discuss the possibility of stress and so she agreed to do a trial of Lexapro which she had taken previously just to see if this was helpful.  If not consider testing for food allergies and possibly GI evaluation.

## 2018-09-21 NOTE — Progress Notes (Signed)
Pt reports for about 1 mo she has been back and forth regarding her abdominal sxs.  She stated that she has had some,SOB,upper abdominal pain, some days the medication will help and other days it would not.   She stated that she has been seen by a counselor in the past and her mother told her that since she knows how to cope w/her anxiety and depression it could be what's going on.Maryruth Eve, Lahoma Crocker, CMA

## 2018-09-21 NOTE — Progress Notes (Signed)
Virtual Visit via Video Note  I connected with Ruth Citizenaylor Bocchino on 09/21/18 at  3:00 PM EDT by a video enabled telemedicine application and verified that I am speaking with the correct person using two identifiers.   I discussed the limitations of evaluation and management by telemedicine and the availability of in person appointments. The patient expressed understanding and agreed to proceed.     Established Patient Office Visit  Subjective:  Patient ID: Ruth Aguilar, female    DOB: 07/04/1995  Age: 23 y.o. MRN: [id number]  CC:  Chief Complaint  Patient presents with  . mood    HPI Ruth Citizenaylor Twining presents for follow-up on epigastric pain.  She reports that it continues to be severe at times and episodic.  She does not usually go more than 1 or 2 days without having some pain.  She did try taking the Prilosec and the nausea medicine daily for the last several weeks and says that she really cannot tell a big difference in her symptoms.  She gets very nauseated but has not vomited.  And try taking the Prilosec twice a day.  She even tried eliminating dairy and gluten temporarily but there did not seem to be any improvement or trending towards improvement.  Does that at times it bothers her to the point that she almost feels short of breath with it and sometimes just the nausea that she experiences.  Interestingly she reports that her younger sister has had very similar symptoms and has been undergoing a pretty extensive GI work-up.  To this point in time they have not found any specific cause.  Patient reports that several years ago when she first started college she had really high levels of anxiety to the point of having vomiting, abdominal pain and diarrhea with occasional incontinence.  At the time she was tried on medication and did some formal therapy.  She tried Zoloft and felt extremely sedated on it but then was placed on Lexapro and says she actually stayed on that probably for couple of  years.  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  Though she feels pretty confident in her online skills.  And she is also been remodeling her home.  So even though she does not feel like she is overly stressed she is wondering if that could be part of the persistent abdominal pain.  She is not had any vomiting or blood in the stool.  History reviewed. No pertinent past medical history.  History reviewed. No pertinent surgical history.  Family History  Problem Relation Age of Onset  . Healthy Mother   . Healthy Father     Social History   Socioeconomic History  . Marital status: Single    Spouse name: Not on file  . Number of children: Not on file  . Years of education: Not on file  . Highest education level: Not on file  Occupational History  . Not on file  Social Needs  . Financial resource strain: Not on file  . Food insecurity    Worry: Not on file    Inability: Not on file  . Transportation needs    Medical: Not on file    Non-medical: Not on file  Tobacco Use  . Smoking status: Never Smoker  . Smokeless tobacco: Never Used  Substance and Sexual Activity  . Alcohol use: No  . Drug use: No  . Sexual activity: Never  Birth control/protection: Pill  Lifestyle  . Physical activity    Days per week: Not on file    Minutes per session: Not on file  . Stress: Not on file  Relationships  . Social Herbalist on phone: Not on file    Gets together: Not on file    Attends religious service: Not on file    Active member of club or organization: Not on file    Attends meetings of clubs or organizations: Not on file    Relationship status: Not on file  . Intimate partner violence    Fear of current or ex partner: Not on file    Emotionally abused: Not on file    Physically abused: Not on file    Forced sexual activity: Not on file  Other Topics Concern  . Not on file  Social History Narrative   . Not on file    Outpatient Medications Prior to Visit  Medication Sig Dispense Refill  . B Complex Vitamins (B COMPLEX PO) Take 2 capsules by mouth daily.    Marland Kitchen ESTARYLLA 0.25-35 MG-MCG tablet     . omeprazole (PRILOSEC) 40 MG capsule Take 1 capsule (40 mg total) by mouth daily. 30 capsule 0  . promethazine (PHENERGAN) 25 MG tablet Take 1 tablet (25 mg total) by mouth every 8 (eight) hours as needed for nausea or vomiting. 20 tablet 0   No facility-administered medications prior to visit.     No Known Allergies  ROS Review of Systems    Objective:    Physical Exam  Constitutional: She is oriented to person, place, and time. She appears well-developed and well-nourished.  Well groomed  HENT:  Head: Normocephalic.  Pulmonary/Chest: Effort normal.  Neurological: She is alert and oriented to person, place, and time.  Psychiatric: She has a normal mood and affect. Her behavior is normal.    Temp 97.9 F (36.6 C)   Wt 120 lb (54.4 kg)   LMP 07/28/2018 (Exact Date)   BMI 21.58 kg/m  Wt Readings from Last 3 Encounters:  09/21/18 120 lb (54.4 kg)  08/14/18 119 lb 1.6 oz (54 kg)  05/13/18 118 lb (53.5 kg)     Health Maintenance Due  Topic Date Due  . HIV Screening  01/27/2008  . PAP-Cervical Cytology Screening  01/26/2014  . PAP SMEAR-Modifier  04/26/2014  . INFLUENZA VACCINE  08/22/2018    There are no preventive care reminders to display for this patient.  No results found for: TSH Lab Results  Component Value Date   WBC 7.0 06/08/2013   HGB 13.4 06/08/2013   HCT 40.5 06/08/2013   MCV 88.4 06/08/2013   PLT 277 06/08/2013   Lab Results  Component Value Date   NA 139 08/15/2018   K 4.7 08/15/2018   CO2 28 08/15/2018   GLUCOSE 104 (H) 08/15/2018   BUN 19 08/15/2018   CREATININE 1.04 08/15/2018   BILITOT 0.7 08/15/2018   ALKPHOS 37 (L) 06/08/2013   AST 24 08/15/2018   ALT 19 08/15/2018   PROT 7.5 08/15/2018   ALBUMIN 4.3 06/08/2013   CALCIUM 9.8  08/15/2018   No results found for: CHOL No results found for: HDL No results found for: LDLCALC No results found for: TRIG No results found for: CHOLHDL No results found for: HGBA1C    Assessment & Plan:   Problem List Items Addressed This Visit      Other   Liver lesion    4.2  x 3.7 cm segment 6 liver lesion has 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  Repeat ultrasound in 1 year.      Epigastric pain - Primary    PPI was not helpful.  Ultrasound showed a liver lesion with follow-up MRI showing likely benign lesion though they did recommend repeat with ultrasound in 1 year.  Eliminating gluten and dairy for short periods of time have not helped.  We did discuss the possibility of stress and so she agreed to do a trial of Lexapro which she had taken previously just to see if this was helpful.  If not consider testing for food allergies and possibly GI evaluation.       Other Visit Diagnoses    Stress         [place] though it does not feel overwhelming at this point time work on a try restarting Lexapro and see if over the next few weeks if she finds it is helpful.  Meds ordered this encounter  Medications  . escitalopram (LEXAPRO) 10 MG tablet    Sig: 1/2 tab po QD x 8 days the increase to whole tab daily.    Dispense:  30 tablet    Refill:  1    Follow-up: No follow-ups on file.     I discussed the assessment and treatment plan with the patient. The patient was provided an opportunity to ask questions and all were answered. The patient agreed with the plan and demonstrated an understanding of the instructions.   The patient was advised to call back or seek an in-person evaluation if the symptoms worsen or if the condition fails to improve as anticipated.  Nani Gasser, MD

## 2018-10-11 ENCOUNTER — Encounter: Payer: Self-pay | Admitting: Family Medicine

## 2018-10-13 ENCOUNTER — Encounter: Payer: Self-pay | Admitting: Family Medicine

## 2018-10-13 MED ORDER — ESCITALOPRAM OXALATE 10 MG PO TABS: 10.0000 mg | ORAL_TABLET | Freq: Every day | ORAL | 1 refills | Status: DC

## 2018-10-13 MED ORDER — ESTARYLLA 0.25-35 MG-MCG PO TABS: 1.0000 | ORAL_TABLET | Freq: Every day | ORAL | 4 refills | Status: DC

## 2018-10-14 ENCOUNTER — Other Ambulatory Visit: Payer: Self-pay | Admitting: *Deleted

## 2018-10-14 MED ORDER — ESTARYLLA 0.25-35 MG-MCG PO TABS: 1.0000 | ORAL_TABLET | Freq: Every day | ORAL | 4 refills | Status: DC

## 2019-04-19 ENCOUNTER — Other Ambulatory Visit: Payer: Self-pay | Admitting: *Deleted

## 2019-04-19 MED ORDER — PROMETHAZINE HCL 25 MG PO TABS: 25.0000 mg | ORAL_TABLET | Freq: Three times a day (TID) | ORAL | 0 refills | Status: DC | PRN

## 2020-01-24 ENCOUNTER — Encounter: Payer: Self-pay | Admitting: Family Medicine

## 2020-01-24 ENCOUNTER — Telehealth (INDEPENDENT_AMBULATORY_CARE_PROVIDER_SITE_OTHER): Payer: BC Managed Care – PPO | Admitting: Family Medicine

## 2020-01-24 DIAGNOSIS — K209 Esophagitis, unspecified without bleeding: Secondary | ICD-10-CM

## 2020-01-24 MED ORDER — OMEPRAZOLE 40 MG PO CPDR: 40.0000 mg | DELAYED_RELEASE_CAPSULE | Freq: Every day | ORAL | 3 refills | Status: DC

## 2020-01-24 NOTE — Progress Notes (Signed)
Virtual Visit via telephone Note  I is unable to connected with Ruth Aguilar on 01/24/20 at  1:00 PM EST by a video enabled telemedicine application and verified that I am speaking with the correct person using two identifiers.  Format had to be switched to telephone.   I discussed the limitations of evaluation and management by telemedicine and the availability of in person appointments. The patient expressed understanding and agreed to proceed.  Patient location: at home  Provider location: in office  Subjective:    CC: chest pain  HPI: Pt reports that her sxs began 3 days ago. She said that when she eats something she gets a really bad pain in her center of her chest. She has tried taking Tums to see if this would relieve it but it did not. When she belches after a meal she said that this is also painful. Asked if she has similar sxs when drinking she stated that she does. Has been taking an NSAID. Pain with swallowing in her chest. Has been chewing her food more so it is less comfortable.  First time happened in the middle of the night. NO SOB or cough.    Had cholestasis during pregnancy.   Past medical history, Surgical history, Family history not pertinant except as noted below, Social history, Allergies, and medications have been entered into the medical record, reviewed, and corrections made.   Review of Systems: No fevers, chills, night sweats, weight loss, chest pain, or shortness of breath.   Objective:    General: Speaking clearly in complete sentences without any shortness of breath.  Alert and oriented x3.  Normal judgment. No apparent acute distress.    Impression and Recommendations:    No problem-specific Assessment & Plan notes found for this encounter.  Esophagitis based on her description of symptoms.  Does not sound cardiac or pulmonary as it occurs during eating.  Recommend a trial of PPI with no prescription for omeprazole to reduce acid.  Recommend that she  immediately stop the NSAID that she has been taking for the last week as well as avoid anything acidic such as marinara, citrus, caffeine, coffee tea or soda etc.  Hopefully her symptoms will improve over the next week but if not then please let me know consider referral to GI at that point if she is not improving.    Time spent in encounter 21 minutes  I discussed the assessment and treatment plan with the patient. The patient was provided an opportunity to ask questions and all were answered. The patient agreed with the plan and demonstrated an understanding of the instructions.   The patient was advised to call back or seek an in-person evaluation if the symptoms worsen or if the condition fails to improve as anticipated.   Nani Gasser, MD

## 2020-01-24 NOTE — Progress Notes (Signed)
Pt reports that her sxs began 3 days ago. She said that when she eats something she gets a really bad pain in her center of her chest. She has tried taking Tums to see if this would relieve it but it did not. When she belches after a meal she said that this is also painful. Asked if she has similar sxs when drinking she stated that she does.

## 2020-01-28 ENCOUNTER — Encounter: Payer: Self-pay | Admitting: Gastroenterology

## 2020-01-28 ENCOUNTER — Telehealth: Payer: Self-pay

## 2020-01-28 DIAGNOSIS — K209 Esophagitis, unspecified without bleeding: Secondary | ICD-10-CM

## 2020-01-28 IMAGING — MR MRI ABDOMEN WITH AND WITHOUT CONTRAST
10 of 19 series · 21 of 48 positions shown · IV contrast (gadavist)
Comparison: Abdominal ultrasound examination 08/15/2018

CLINICAL DATA: Follow-up hepatic lesions seen on recent ultrasound
examination.

EXAM:
MRI ABDOMEN WITHOUT AND WITH CONTRAST
TECHNIQUE: Multiplanar multisequence MR imaging of the abdomen was performed
both before and after the administration of intravenous contrast.
CONTRAST:  5 cc Gadavist

[Series 1: 3 plane non · axial · 8.0mm · 0.78mm/px · z∈[-36,+200]mm · 2 of 13 slices shown]
[im 1/13]
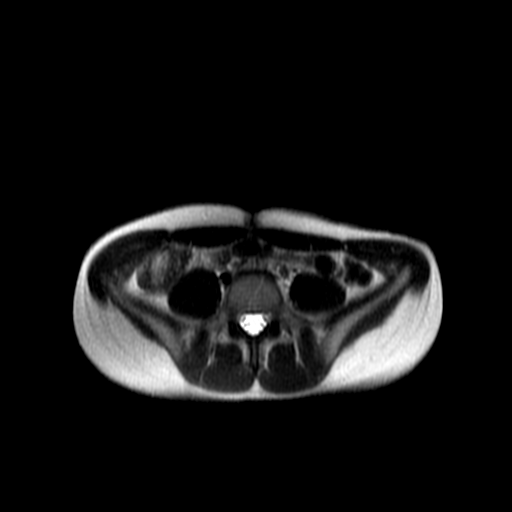
[im 13/13]
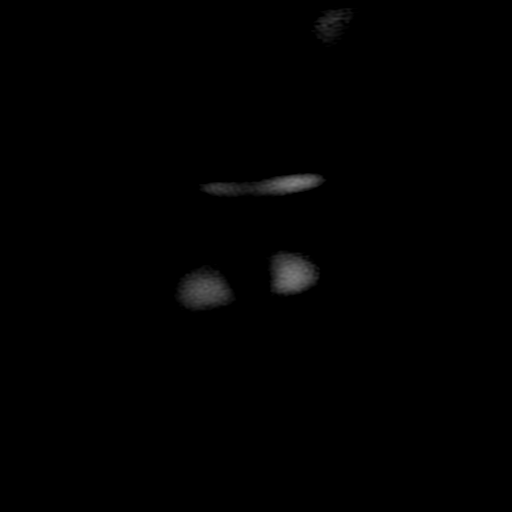

[Series 3: T2 fat-sat · axial · 5.0mm · 0.68mm/px · z∈[-38,+232]mm · 2 of 55 slices shown]
[im 1/55]
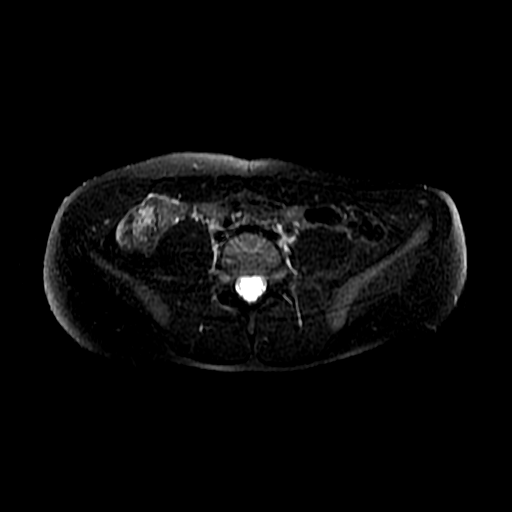
[im 55/55]
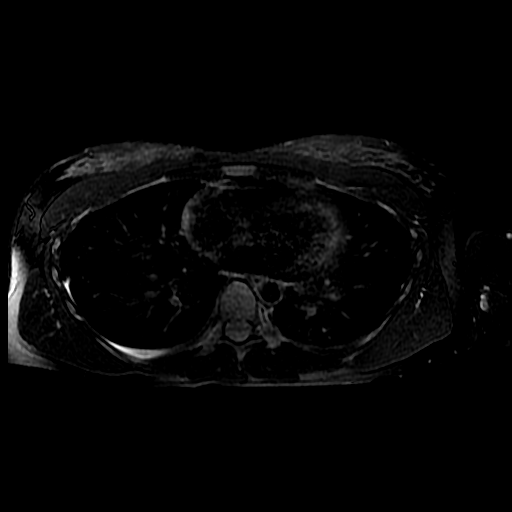

[Series 4: DWI b500 · axial · 6.0mm · 1.48mm/px · z∈[-41,+232]mm · 2 of 72 slices shown]
[im 1/72]
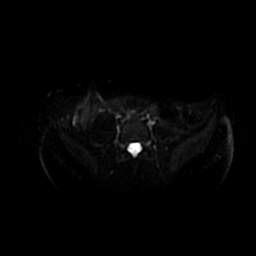
[im 72/72]
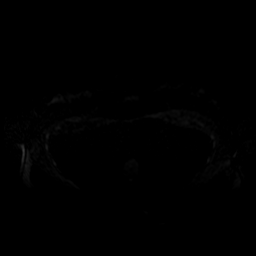

[Series 5: T2 · axial · 5.0mm · 0.68mm/px · z∈[-59,+211]mm · 2 of 55 slices shown (1 of 2)]
[im 1/55]
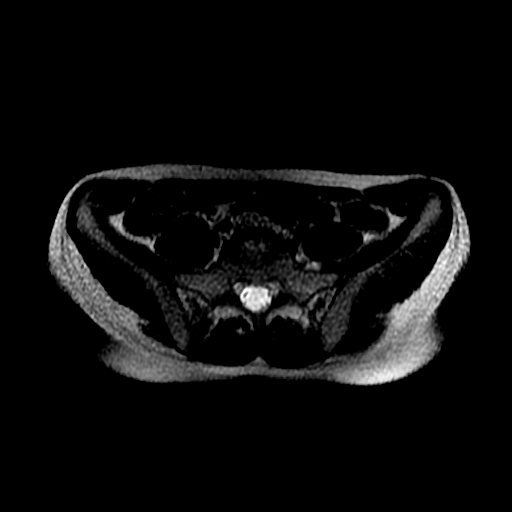
[im 55/55]
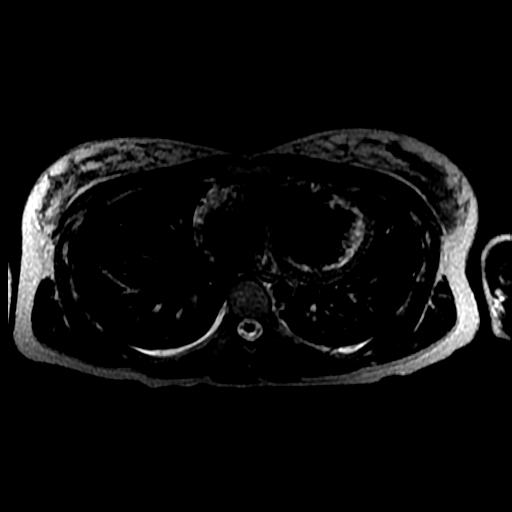

[Series 6: T2 · coronal · 5.0mm · 0.78mm/px · 1 of 37 slices shown (2 of 2)]
[im 1/37]
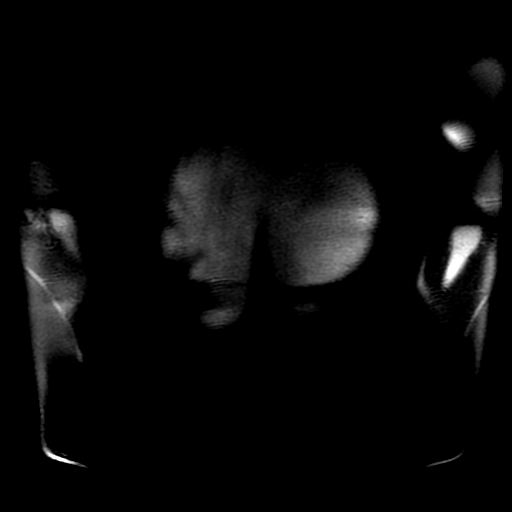

[Series 7: bSSFP · axial · 5.0mm · 0.68mm/px · z∈[-59,+211]mm · 2 of 55 slices shown]
[im 1/55]
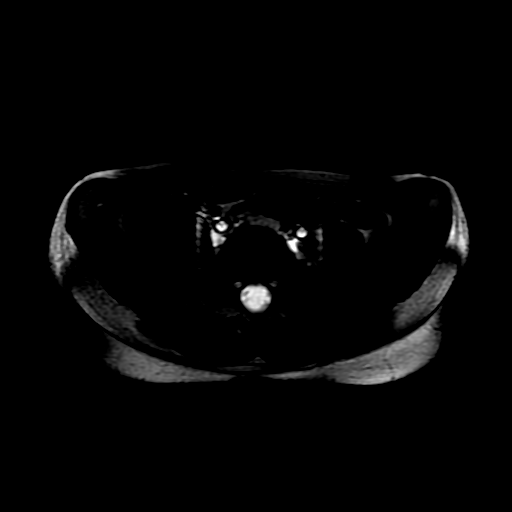
[im 55/55]
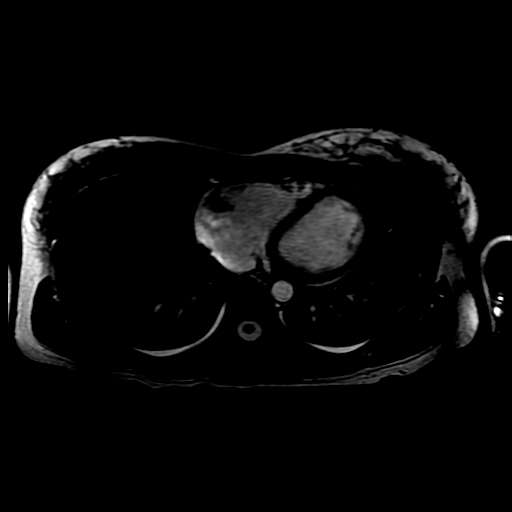

[Series 8: ax dualecho bh · axial · 5.0mm · 0.68mm/px · z∈[-38,+232]mm · 3 of 110 slices shown]
[im 1/110]
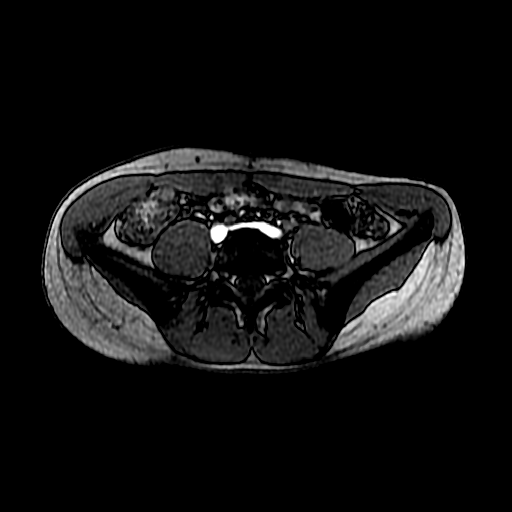
[im 55/110]
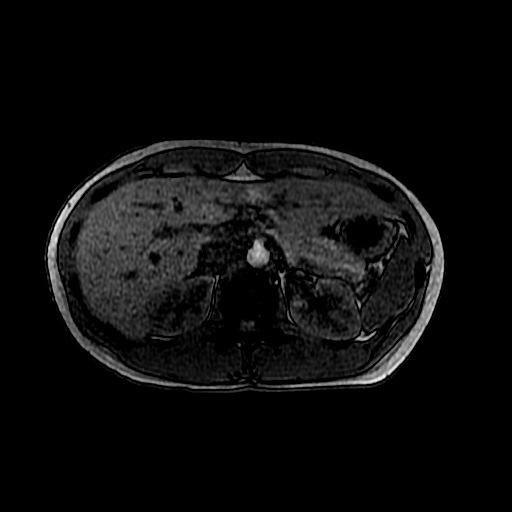
[im 110/110]
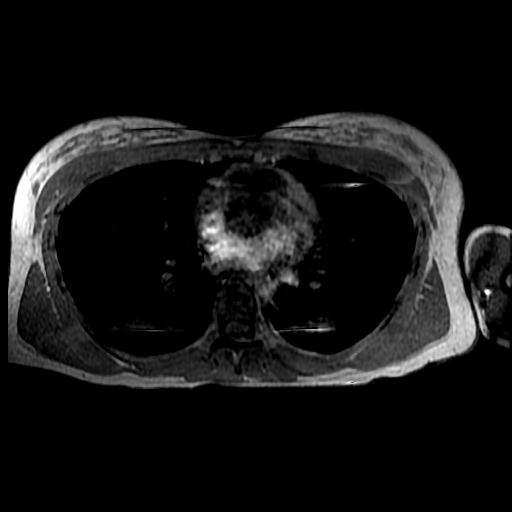

[Series 400: DWI · axial · 6.0mm · 1.48mm/px · 1 of 36 slices shown]
[im 1/36]
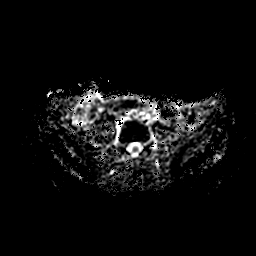

[Series 900: T1 dynamic · axial · 6.0mm · 0.68mm/px · z∈[-77,+232]mm · 3 of 104 slices shown (1 of 2)]
[im 1/104]
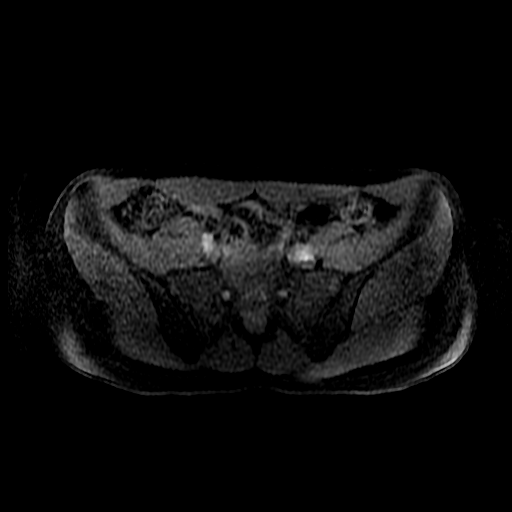
[im 52/104]
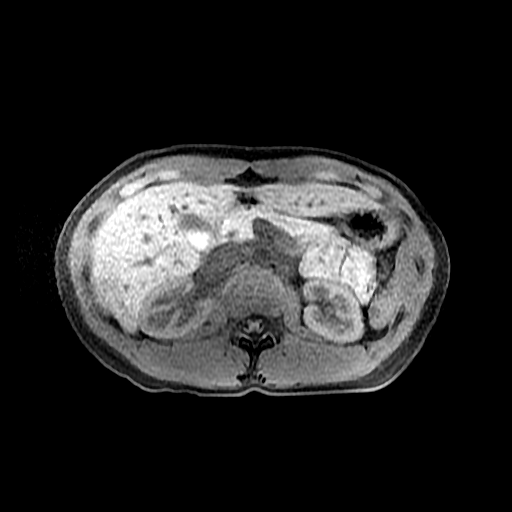
[im 104/104]
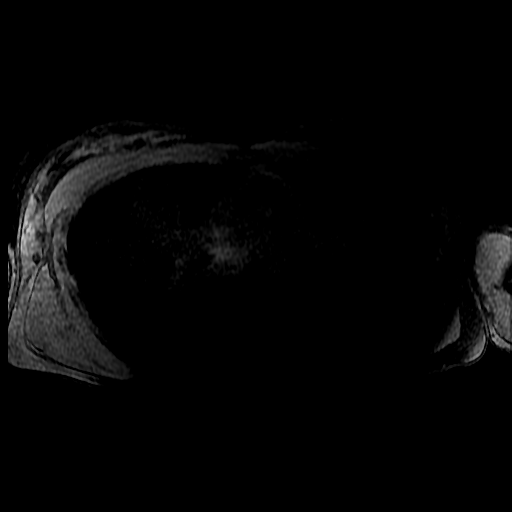

[Series 901: T1 dynamic · axial · 6.0mm · 0.68mm/px · z∈[-77,+232]mm · 3 of 104 slices shown (2 of 2)]
[im 1/104]
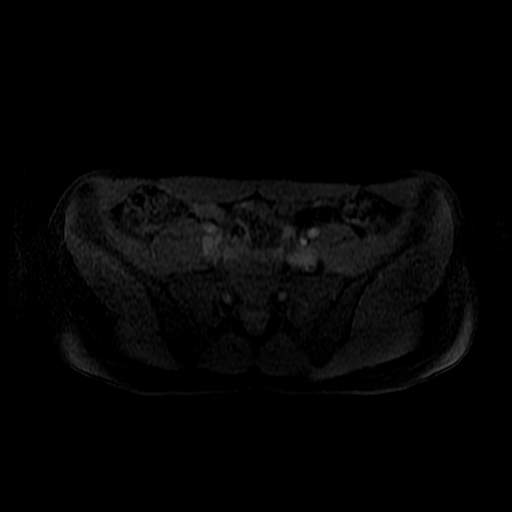
[im 52/104]
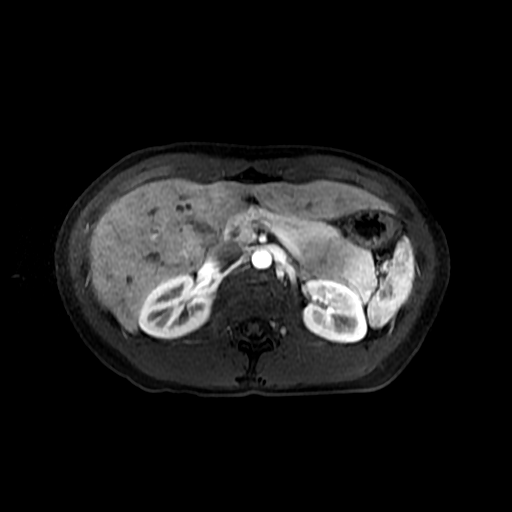
[im 104/104]
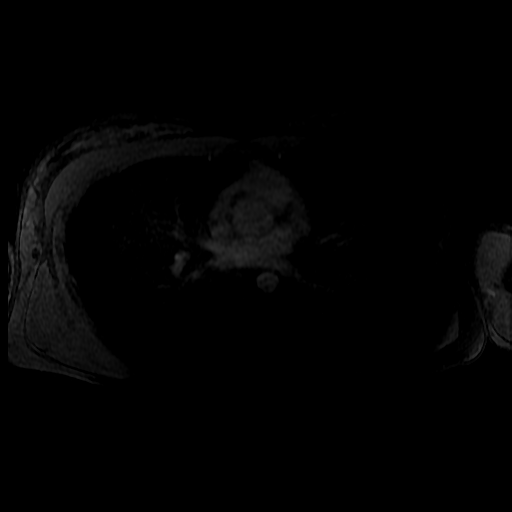

[21 of 48 positions shown; findings below may reference images not displayed]

FINDINGS: Lower chest: The lung bases are clear. No worrisome pulmonary
lesions. No pleural or pericardial effusion.

Hepatobiliary: Slightly exophytic 4.2 x 3.7 cm segment 6 liver
lesion has classic MR imaging features of benign focal nodular
hyperplasia (FNH). The lesion is quite stealth on the T2 weighted
images except for a central area of increased T2 signal intensity.
This demonstrates marked early arterial phase contrast enhancement
followed by rapid washout and delayed enhancement of the central
portion.

No other hepatic lesions are identified. The portal and hepatic
veins are normal. The gallbladder is normal. Normal caliber and
course of the common bile duct.

Pancreas:  No mass, inflammation or ductal dilatation.

Spleen:  Normal size.  No focal lesions.

Adrenals/Urinary Tract:  The adrenal glands and kidneys are normal.

Stomach/Bowel: Visualized portions within the abdomen are
unremarkable.

Vascular/Lymphatic: No pathologically enlarged lymph nodes
identified. No abdominal aortic aneurysm demonstrated.

Other:  No ascites or abdominal wall hernia

Musculoskeletal: No significant bony findings.
IMPRESSION: 1. 4.2 x 3.7 cm segment 6 liver lesion has classic MR imaging
features of benign focal nodular hyperplasia (FNH). This is very
well seen on ultrasound and I think it would be reasonable to get a
follow-up right upper quadrant ultrasound examination in 1 year to
document stability in size.
2. Normal MR abdomen otherwise.

## 2020-01-28 MED ORDER — SUCRALFATE 1 G PO TABS: 1.0000 g | ORAL_TABLET | Freq: Three times a day (TID) | ORAL | 1 refills | Status: DC

## 2020-01-28 MED ORDER — NYSTATIN 100000 UNIT/ML MT SUSP: 5.0000 mL | Freq: Four times a day (QID) | OROMUCOSAL | 0 refills | Status: DC

## 2020-01-28 NOTE — Telephone Encounter (Signed)
Patient advised of recommendations.  

## 2020-01-28 NOTE — Telephone Encounter (Signed)
Ruth Aguilar states she is still having terrible esophagus pain. She is wanting to know if there is any medication she can take for the pain. She mentioned Magic Mouthwash. She complains it hurts to even drink water.

## 2020-01-28 NOTE — Telephone Encounter (Signed)
Okay.  Please tell her to continue with the omeprazole.  Okay to increase to twice a day temporarily.  I am also going to send over a prescription called Carafate that she will take about 10 minutes before she eats.  This will coat the lining of the stomach.  And then also get a send over prescription for nystatin swish and swallow just in case she has any type of thrush in the esophagus that is causing her pain that should clear that up as well.  I did go ahead and send a referral over to GI so that we can get a appointment scheduled for consultation especially if she is not improving.  If over the next week or 2 she feels like her symptoms completely resolved then we can always cancel the appointment if needed but I rather go ahead and start working on it instead of starting from scratch 2 weeks from now.

## 2020-02-15 ENCOUNTER — Ambulatory Visit: Payer: BC Managed Care – PPO | Admitting: Gastroenterology

## 2020-07-28 ENCOUNTER — Encounter: Payer: Self-pay | Admitting: Family Medicine

## 2020-08-02 ENCOUNTER — Telehealth (INDEPENDENT_AMBULATORY_CARE_PROVIDER_SITE_OTHER): Payer: 59 | Admitting: Family Medicine

## 2020-08-02 ENCOUNTER — Encounter: Payer: Self-pay | Admitting: Family Medicine

## 2020-08-02 DIAGNOSIS — F429 Obsessive-compulsive disorder, unspecified: Secondary | ICD-10-CM | POA: Insufficient documentation

## 2020-08-02 DIAGNOSIS — N926 Irregular menstruation, unspecified: Secondary | ICD-10-CM

## 2020-08-02 DIAGNOSIS — F39 Unspecified mood [affective] disorder: Secondary | ICD-10-CM | POA: Diagnosis not present

## 2020-08-02 NOTE — Progress Notes (Signed)
Virtual Visit via Telephone Note  I connected with Ruth Aguilar on 08/08/20 at 11:10 AM EDT by telephone and verified that I am speaking with the correct person using two identifiers.   I discussed the limitations, risks, security and privacy concerns of performing an evaluation and management service by telephone and the availability of in person appointments. I also discussed with the patient that there may be a patient responsible charge related to this service. The patient expressed understanding and agreed to proceed.  Patient location: at home  Provider loccation: In office   Subjective:    CC: check hormones.    HPI: Had a baby about 1 yr ago. Started feeling very depressed about the last 6 mo. 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 Her period has started back and irregular. No hx of PCOS. No headaches.  + suicidal for about 3 months and tried multiple medications.  Has been dx with Bipolar as well. No anemia or diabetes during pregnancy. She wanted to have some blood work and have her hormones checked as well.     Past medical history, Surgical history, Family history not pertinant except as noted below, Social history, Allergies, and medications have been entered into the medical record, reviewed, and corrections made.   Review of Systems: No fevers, chills, night sweats, weight loss, chest pain, or shortness of breath.   Objective:    General: Speaking clearly in complete sentences without any shortness of breath.  Alert and oriented x3.  Normal judgment. No apparent acute distress.    Impression and Recommendations:    MoodD/O - discussed options. Will check hormone levels. Will check Thyroid as well.  I am glad to here she is doing much better on her medication regimen.      I discussed the assessment and treatment plan with the patient. The patient was provided an opportunity to ask questions and all were answered. The patient agreed with the plan and demonstrated an  understanding of the instructions.   The patient was advised to call back or seek an in-person evaluation if the symptoms worsen or if the condition fails to improve as anticipated.  I provided 21 minutes of non-face-to-face time during this encounter.   Nani Gasser, MD

## 2020-08-02 NOTE — Progress Notes (Signed)
Pt was advised that pcp was running late. She said that she had a meeting @ 1130. She said she didn't understand why she had to have this appt to have her hormones checked.

## 2020-08-04 ENCOUNTER — Encounter: Payer: Self-pay | Admitting: Family Medicine

## 2020-08-04 LAB — HEMOGLOBIN A1C
Hgb A1c MFr Bld: 5.2 % of total Hgb (ref <5.7)
Mean Plasma Glucose: 103 mg/dL
eAG (mmol/L): 5.7 mmol/L

## 2020-08-04 LAB — COMPLETE METABOLIC PANEL WITH GFR
AG Ratio: 2 (calc) (ref 1.0–2.5)
ALT: 15 U/L (ref 6–29)
AST: 18 U/L (ref 10–30)
Albumin: 4.6 g/dL (ref 3.6–5.1)
Alkaline phosphatase (APISO): 39 U/L (ref 31–125)
BUN/Creatinine Ratio: 16 (calc) (ref 6–22)
BUN: 16 mg/dL (ref 7–25)
CO2: 27 mmol/L (ref 20–32)
Calcium: 9.8 mg/dL (ref 8.6–10.2)
Chloride: 102 mmol/L (ref 98–110)
Creat: 0.97 mg/dL — ABNORMAL HIGH (ref 0.50–0.96)
Globulin: 2.3 g/dL (calc) (ref 1.9–3.7)
Glucose, Bld: 99 mg/dL (ref 65–139)
Potassium: 4 mmol/L (ref 3.5–5.3)
Sodium: 136 mmol/L (ref 135–146)
Total Bilirubin: 0.7 mg/dL (ref 0.2–1.2)
Total Protein: 6.9 g/dL (ref 6.1–8.1)
eGFR: 82 mL/min/{1.73_m2} (ref >=60)

## 2020-08-04 LAB — CBC WITH DIFFERENTIAL/PLATELET
Absolute Monocytes: 586 cells/uL (ref 200–950)
Basophils Absolute: 56 cells/uL (ref 0–200)
Basophils Relative: 0.6 %
Eosinophils Absolute: 242 cells/uL (ref 15–500)
Eosinophils Relative: 2.6 %
HCT: 38 % (ref 35.0–45.0)
Hemoglobin: 12.5 g/dL (ref 11.7–15.5)
Lymphs Abs: 2195 cells/uL (ref 850–3900)
MCH: 30.3 pg (ref 27.0–33.0)
MCHC: 32.9 g/dL (ref 32.0–36.0)
MCV: 92.2 fL (ref 80.0–100.0)
MPV: 10.3 fL (ref 7.5–12.5)
Monocytes Relative: 6.3 %
Neutro Abs: 6222 cells/uL (ref 1500–7800)
Neutrophils Relative %: 66.9 %
Platelets: 273 10*3/uL (ref 140–400)
RBC: 4.12 10*6/uL (ref 3.80–5.10)
RDW: 12.6 % (ref 11.0–15.0)
Total Lymphocyte: 23.6 %
WBC: 9.3 10*3/uL (ref 3.8–10.8)

## 2020-08-04 LAB — TSH: TSH: 2.54 mIU/L

## 2020-08-04 LAB — FOLLICLE STIMULATING HORMONE: FSH: 11.1 m[IU]/mL

## 2020-08-04 LAB — PROGESTERONE: Progesterone: 1.2 ng/mL

## 2020-08-04 LAB — ESTRADIOL: Estradiol: 280 pg/mL

## 2020-08-04 LAB — LUTEINIZING HORMONE: LH: 31.9 m[IU]/mL
